# Patient Record
Sex: Male | Born: 2005 | Race: Black or African American | Hispanic: No | Marital: Single
Health system: Southern US, Community
[De-identification: ages and names within clinical notes are randomized; demographics above are authoritative.]

## PROBLEM LIST (undated history)

## (undated) DIAGNOSIS — F909 Attention-deficit hyperactivity disorder, unspecified type: Secondary | ICD-10-CM

## (undated) DIAGNOSIS — R569 Unspecified convulsions: Secondary | ICD-10-CM

---
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
## 2023-10-27 ENCOUNTER — Encounter: Admitting: Family

## 2023-10-27 NOTE — Progress Notes (Signed)
 Erroneous encounter-disregard

## 2023-12-10 ENCOUNTER — Ambulatory Visit: Admitting: Family

## 2024-01-29 ENCOUNTER — Encounter: Admitting: Family

## 2024-01-29 NOTE — Progress Notes (Signed)
 Erroneous encounter-disregard

## 2024-08-30 ENCOUNTER — Encounter (HOSPITAL_COMMUNITY): Payer: Self-pay

## 2024-08-30 ENCOUNTER — Other Ambulatory Visit: Payer: Self-pay

## 2024-08-30 ENCOUNTER — Emergency Department (HOSPITAL_COMMUNITY)

## 2024-08-30 ENCOUNTER — Emergency Department (HOSPITAL_COMMUNITY)
Admission: EM | Admit: 2024-08-30 | Discharge: 2024-08-30 | Disposition: A | Discharge status: Home / Self Care | Attending: Emergency Medicine | Admitting: Emergency Medicine

## 2024-08-30 DIAGNOSIS — R000 Tachycardia, unspecified: Secondary | ICD-10-CM | POA: Insufficient documentation

## 2024-08-30 DIAGNOSIS — K529 Noninfective gastroenteritis and colitis, unspecified: Secondary | ICD-10-CM | POA: Insufficient documentation

## 2024-08-30 DIAGNOSIS — R55 Syncope and collapse: Secondary | ICD-10-CM | POA: Insufficient documentation

## 2024-08-30 LAB — COMPREHENSIVE METABOLIC PANEL WITH GFR
ALT: 21 U/L (ref 0–44)
AST: 27 U/L (ref 15–41)
Albumin: 4.4 g/dL (ref 3.5–5.0)
Alkaline Phosphatase: 133 U/L — ABNORMAL HIGH (ref 38–126)
Anion gap: 13 (ref 5–15)
BUN: 21 mg/dL — ABNORMAL HIGH (ref 6–20)
CO2: 24 mmol/L (ref 22–32)
Calcium: 9.2 mg/dL (ref 8.9–10.3)
Chloride: 103 mmol/L (ref 98–111)
Creatinine, Ser: 1.22 mg/dL (ref 0.61–1.24)
GFR, Estimated: >60 mL/min
Glucose, Bld: 115 mg/dL — ABNORMAL HIGH (ref 70–99)
Potassium: 3.8 mmol/L (ref 3.5–5.1)
Sodium: 140 mmol/L (ref 135–145)
Total Bilirubin: 0.6 mg/dL (ref 0.0–1.2)
Total Protein: 7.5 g/dL (ref 6.5–8.1)

## 2024-08-30 LAB — URINALYSIS, ROUTINE W REFLEX MICROSCOPIC
Bilirubin Urine: NEGATIVE
Glucose, UA: NEGATIVE mg/dL
Hgb urine dipstick: NEGATIVE
Ketones, ur: 20 mg/dL — AB
Leukocytes,Ua: NEGATIVE
Nitrite: NEGATIVE
Protein, ur: 100 mg/dL — AB
Specific Gravity, Urine: 1.032 — ABNORMAL HIGH (ref 1.005–1.030)
pH: 5 (ref 5.0–8.0)

## 2024-08-30 LAB — CBC
HCT: 51.9 % (ref 39.0–52.0)
Hemoglobin: 18 g/dL — ABNORMAL HIGH (ref 13.0–17.0)
MCH: 29.6 pg (ref 26.0–34.0)
MCHC: 34.7 g/dL (ref 30.0–36.0)
MCV: 85.4 fL (ref 80.0–100.0)
Platelets: 234 10*3/uL (ref 150–400)
RBC: 6.08 MIL/uL — ABNORMAL HIGH (ref 4.22–5.81)
RDW: 12.5 % (ref 11.5–15.5)
WBC: 7.8 10*3/uL (ref 4.0–10.5)
nRBC: 0 % (ref 0.0–0.2)

## 2024-08-30 LAB — MAGNESIUM: Magnesium: 1.9 mg/dL (ref 1.7–2.4)

## 2024-08-30 LAB — TROPONIN T, HIGH SENSITIVITY
Troponin T High Sensitivity: 6 ng/L (ref 0–19)
Troponin T High Sensitivity: 9 ng/L (ref 0–19)

## 2024-08-30 LAB — LIPASE, BLOOD: Lipase: 12 U/L (ref 11–51)

## 2024-08-30 MED ORDER — LACTATED RINGERS IV BOLUS
1000.0000 mL | Freq: Once | INTRAVENOUS | Status: AC
  Administered 2024-08-30: 1000 mL via INTRAVENOUS

## 2024-08-30 MED ORDER — ONDANSETRON 4 MG PO TBDP: 4.0000 mg | ORAL_TABLET | Freq: Three times a day (TID) | ORAL | 0 refills | Status: AC | PRN

## 2024-08-30 NOTE — ED Notes (Signed)
 CCMD called and pat placed on monitor

## 2024-08-30 NOTE — Discharge Instructions (Signed)
 You are being prescribed nausea medicine to use if you develop nausea and/or vomiting.  Be sure to increase fluid intake at home to combat dehydration.  If he develops recurrent/intractable vomiting, bloody diarrhea, fever, abdominal pain, or any other new/concerning symptoms then return to the ER.

## 2024-08-30 NOTE — ED Provider Notes (Signed)
 " [place] [facility] AT [facility] Provider Note   CSN: [id number] Arrival date & time: 08/30/24  0901     Patient presents with: Nausea, Diarrhea, and Near Syncopal Event   Thomas Acosta is a 19 y.o. male.   HPI 15 old male with a history of seizures, ADHD, and previously born at 49 weeks presents with vomiting, diarrhea, and near syncope.  Mom provides the history.  The patient started having vomiting and diarrhea last night.  Continued throughout the night and this morning patient stood up in the bathroom and started vomiting and then looked like he was going to pass out.  Mom caught him before he could fall and she noticed that it looked like his eyes were rolling in the back of his head but he still responded to her speaking to him.  Never fully passed out.  Did not have any seizure-like activity.  The patient denies any abdominal pain, chest pain.  The patient has not had any blood in the stool or emesis.  No fevers during this time.  He did have a cold last week but got over that.  Prior to Admission medications  Medication Sig Start Date End Date Taking? Authorizing Provider  ondansetron  (ZOFRAN -ODT) 4 MG disintegrating tablet Take 1 tablet (4 mg total) by mouth every 8 (eight) hours as needed for nausea or vomiting. 08/30/24  Yes Freddi Hamilton, MD  albuterol (PROVENTIL HFA;VENTOLIN HFA) 108 (90 BASE) MCG/ACT inhaler Inhale 2 puffs into the lungs every 6 (six) hours as needed for wheezing or shortness of breath. Patient not taking: Reported on 08/30/2024    [provider]  albuterol (PROVENTIL) (2.5 MG/3ML) 0.083% nebulizer solution Take 2.5 mg by nebulization every 6 (six) hours as needed for wheezing or shortness of breath.  Patient not taking: Reported on 08/30/2024    [provider]  dexmethylphenidate (FOCALIN XR) 10 MG 24 hr capsule Take 10 mg by mouth 2 (two) times daily. Patient not taking: Reported on 08/30/2024    [provider]  diazepam  (DIASTAT  ACUDIAL) 10 MG GEL Place 5 mg rectally daily as needed (seizures). Patient not taking: Reported on 08/30/2024 04/03/12   Lang Maxwell, NP  ethosuximide (ZARONTIN) 250 MG/5ML solution Take 375 mg by mouth 2 (two) times daily. Patient not taking: Reported on 08/30/2024    [provider]    Allergies: Patient has no known allergies.    Review of Systems  Constitutional:  Negative for fever.  Respiratory:  Negative for shortness of breath.   Cardiovascular:  Negative for chest pain.  Gastrointestinal:  Positive for diarrhea and vomiting. Negative for abdominal pain and blood in stool.  Neurological:  Positive for light-headedness. Negative for syncope.    Updated Vital Signs BP 127/84   Pulse 92   Temp 99.3 F (37.4 C) (Oral)   Resp (!) 24   Ht 5' 9 (1.753 m)   Wt 76.7 kg   SpO2 100%   BMI 24.96 kg/m   Physical Exam Vitals and nursing note reviewed.  Constitutional:      General: He is not in acute distress.    Appearance: He is well-developed. He is not ill-appearing or diaphoretic.  HENT:     Head: Normocephalic and atraumatic.  Cardiovascular:     Rate and Rhythm: Regular rhythm. Tachycardia present.     Heart sounds: Normal heart sounds.  Pulmonary:     Effort: Pulmonary effort is normal.     Breath sounds: Normal  breath sounds.  Abdominal:     Palpations: Abdomen is soft.     Tenderness: There is no abdominal tenderness.  Skin:    General: Skin is warm and dry.  Neurological:     Mental Status: He is alert.     (all labs ordered are listed, but only abnormal results are displayed) Labs Reviewed  COMPREHENSIVE METABOLIC PANEL WITH GFR - Abnormal; Notable for the following components:      Result Value   Glucose, Bld 115 (*)    BUN 21 (*)    Alkaline Phosphatase 133 (*)    All other components within normal limits  CBC - Abnormal; Notable for the following components:   RBC 6.08 (*)    Hemoglobin 18.0 (*)    All other  components within normal limits  URINALYSIS, ROUTINE W REFLEX MICROSCOPIC - Abnormal; Notable for the following components:   Color, Urine AMBER (*)    APPearance HAZY (*)    Specific Gravity, Urine 1.032 (*)    Ketones, ur 20 (*)    Protein, ur 100 (*)    Bacteria, UA RARE (*)    All other components within normal limits  LIPASE, BLOOD  MAGNESIUM  CBG MONITORING, ED  TROPONIN T, HIGH SENSITIVITY  TROPONIN T, HIGH SENSITIVITY    EKG: EKG Interpretation Date/Time:  Monday August 30 2024 09:44:35 EST Ventricular Rate:  111 PR Interval:  114 QRS Duration:  76 QT Interval:  316 QTC Calculation: 429 R Axis:   76  Text Interpretation: Sinus tachycardia ST & T wave abnormality, consider inferior ischemia Abnormal ECG  ST/T changes new since 2013 Confirmed by Freddi Hamilton [phone]) on 08/30/2024 9:47:27 AM  Radiology: DG Chest Portable 1 View Result Date: 08/30/2024 EXAM: 1 VIEW XRAY OF THE CHEST 08/30/2024 10:15:00 AM COMPARISON: PA and lateral radiographs of the chest dated 04/03/2012. CLINICAL HISTORY: Syncope. FINDINGS: LUNGS AND PLEURA: No focal pulmonary opacity. No pleural effusion. No pneumothorax. HEART AND MEDIASTINUM: No acute abnormality of the cardiac and mediastinal silhouettes. BONES AND SOFT TISSUES: No acute osseous abnormality. IMPRESSION: 1. No acute cardiopulmonary process. Electronically signed by: Evalene Coho MD 08/30/2024 10:21 AM EST RP Workstation: [id number]     Procedures   Medications Ordered in the ED  lactated ringers  bolus 1,000 mL (0 mLs Intravenous Stopped 08/30/24 1242)                                    Medical Decision Making Amount and/or Complexity of Data Reviewed Labs: ordered.    Details: Normal WBC. Radiology: ordered and independent interpretation performed.    Details: No cardiomegaly ECG/medicine tests: ordered and independent interpretation performed.    Details: Sinus tachycardia with nonspecific changes  Risk Prescription  drug management.   Patient presents with near syncope in the setting of an acute GI illness.  No abdominal pain.  No complaints otherwise including no chest pain, shortness of breath.  He has some nonspecific changes on his ECG which may be rate related but also he has not had an ECG in 13 years and may be a new baseline.  He was given fluids.  His electrolytes are unremarkable.  Given the near syncope in setting with this troponins were sent but are negative.  Low suspicion for cardiomyopathy or myocarditis or ACS.  Doubt PE or dissection.  He has a benign abdominal exam.  Sounds like a viral GI illness, will recommend  supportive care and give a prescription for Zofran .  He has been here for 4+ hours without any vomiting.     Final diagnoses:  Acute gastroenteritis    ED Discharge Orders          Ordered    ondansetron  (ZOFRAN -ODT) 4 MG disintegrating tablet  Every 8 hours PRN        08/30/24 1345               Freddi Hamilton, MD 08/30/24 1348  "
